# Patient Record
Sex: Male | Born: 1985 | Race: White | Hispanic: No | Marital: Married | Smoking: Never smoker
Health system: Southern US, Community
[De-identification: ages and names within clinical notes are randomized; demographics above are authoritative.]

## PROBLEM LIST (undated history)

## (undated) DIAGNOSIS — L309 Dermatitis, unspecified: Secondary | ICD-10-CM

## (undated) HISTORY — PX: TONSILLECTOMY: SUR1361

---

## 2010-05-12 ENCOUNTER — Other Ambulatory Visit: Payer: Self-pay | Admitting: Occupational Medicine

## 2010-05-12 ENCOUNTER — Ambulatory Visit
Admission: RE | Admit: 2010-05-12 | Discharge: 2010-05-12 | Disposition: A | Discharge status: Home / Self Care | Payer: PRIVATE HEALTH INSURANCE | Source: Ambulatory Visit | Attending: Occupational Medicine | Admitting: Occupational Medicine

## 2010-05-12 DIAGNOSIS — Z021 Encounter for pre-employment examination: Secondary | ICD-10-CM

## 2011-12-10 ENCOUNTER — Emergency Department (HOSPITAL_COMMUNITY)
Admission: EM | Admit: 2011-12-10 | Discharge: 2011-12-10 | Discharge status: Against Medical Advice | Payer: Worker's Compensation | Attending: Emergency Medicine | Admitting: Emergency Medicine

## 2011-12-10 ENCOUNTER — Encounter (HOSPITAL_COMMUNITY): Payer: Self-pay | Admitting: *Deleted

## 2011-12-10 DIAGNOSIS — Z049 Encounter for examination and observation for unspecified reason: Secondary | ICD-10-CM | POA: Insufficient documentation

## 2011-12-10 HISTORY — DX: Dermatitis, unspecified: L30.9

## 2011-12-10 NOTE — ED Notes (Signed)
Patient seen by [facility] coverage for PPE
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
## 2011-12-10 NOTE — ED Notes (Signed)
Pt eyes flushed bilaterally with 10cc's of saline.

## 2011-12-10 NOTE — ED Notes (Signed)
Pt requested to have eye flushed with saline.

## 2012-05-21 IMAGING — CR DG CHEST 1V
1 series · 1 of 1 positions shown · non-contrast
Comparison: None.

CLINICAL DATA: Pre employment physical

CHEST - 1 VIEW

[view not recorded]
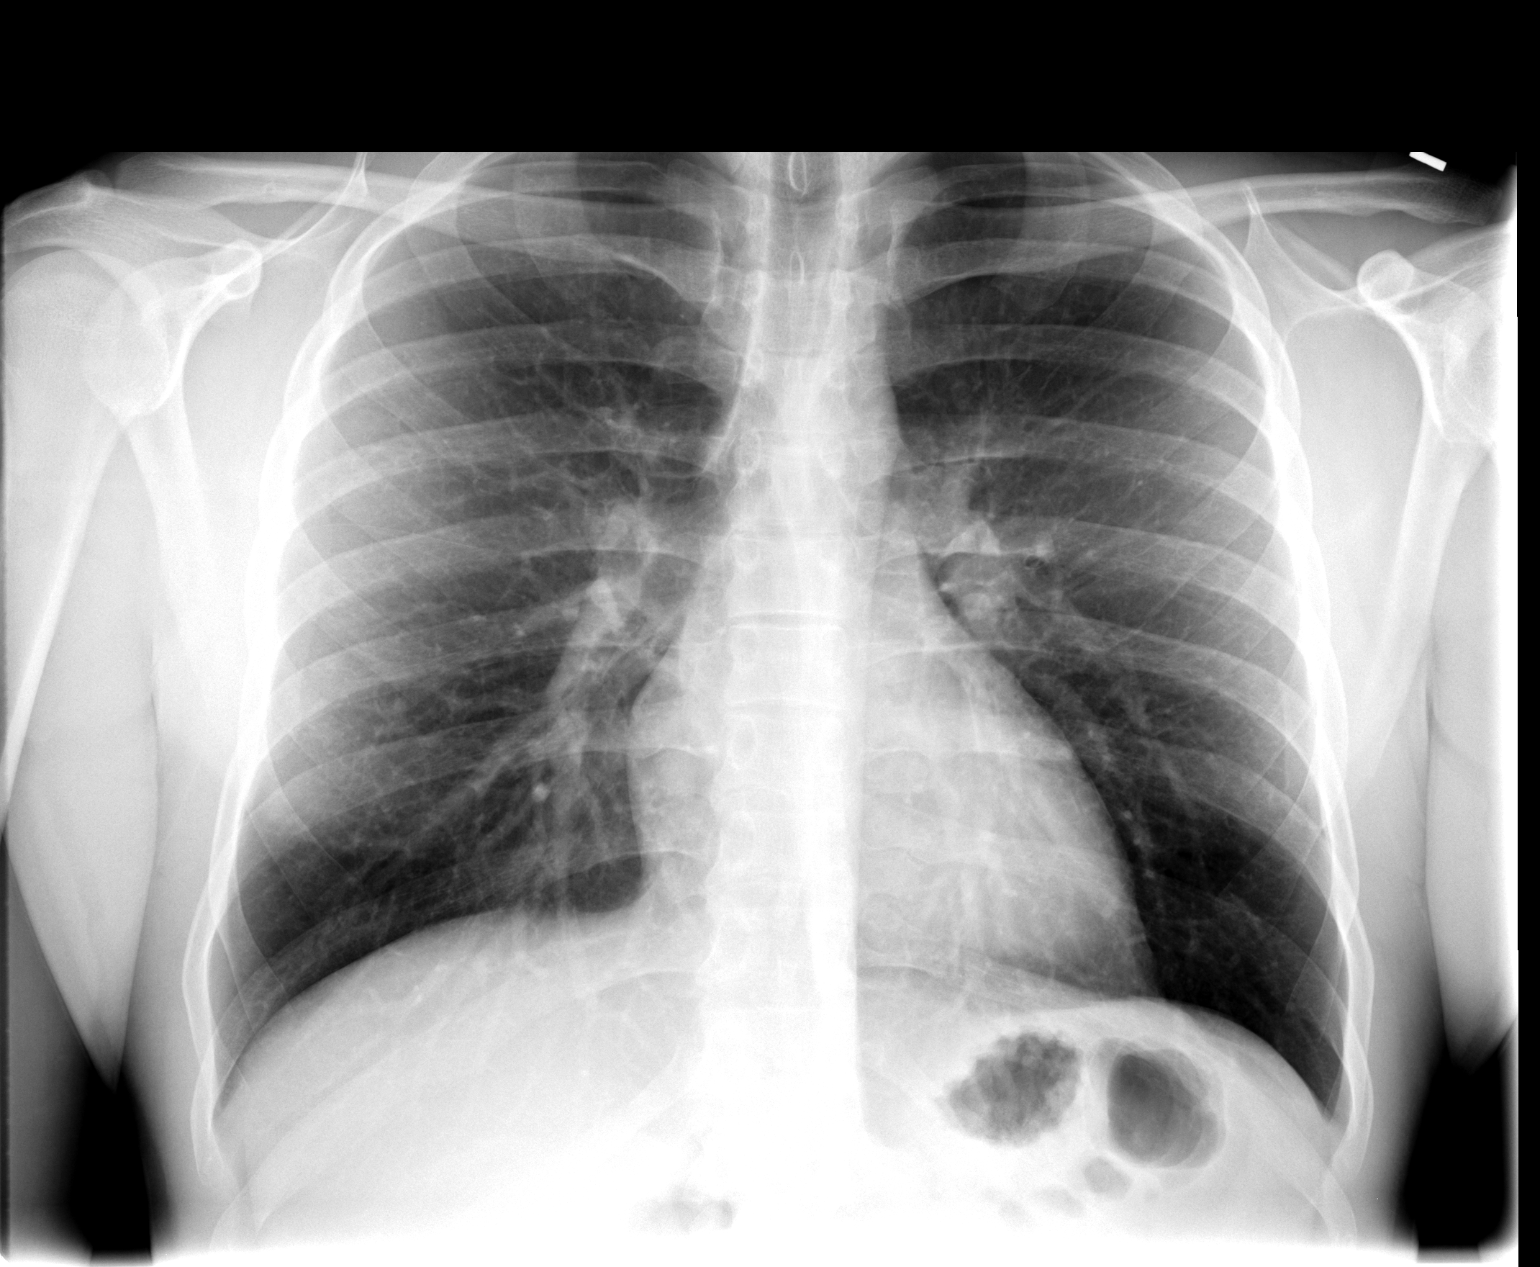

[1 of 1 positions shown; findings below may reference images not displayed]

FINDINGS: The cardiac silhouette, mediastinum, pulmonary
vasculature are within normal limits.  Both lungs are clear.
There is no acute bony abnormality.
IMPRESSION: There is no evidence of acute cardiac or pulmonary process.

## 2018-08-30 ENCOUNTER — Emergency Department (HOSPITAL_COMMUNITY)
Admission: EM | Admit: 2018-08-30 | Discharge: 2018-08-30 | Disposition: A | Discharge status: Home / Self Care | Payer: Worker's Compensation | Attending: Emergency Medicine | Admitting: Emergency Medicine

## 2018-08-30 ENCOUNTER — Encounter (HOSPITAL_COMMUNITY): Payer: Self-pay

## 2018-08-30 ENCOUNTER — Other Ambulatory Visit: Payer: Self-pay

## 2018-08-30 DIAGNOSIS — Z0000 Encounter for general adult medical examination without abnormal findings: Secondary | ICD-10-CM | POA: Diagnosis present

## 2018-08-30 DIAGNOSIS — Z5321 Procedure and treatment not carried out due to patient leaving prior to being seen by health care provider: Secondary | ICD-10-CM | POA: Insufficient documentation

## 2018-08-30 NOTE — ED Triage Notes (Signed)
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 Pt A&Ox4 and ambulatory

## 2020-07-13 ENCOUNTER — Ambulatory Visit (HOSPITAL_COMMUNITY)
Admission: EM | Admit: 2020-07-13 | Discharge: 2020-07-13 | Disposition: A | Discharge status: Home / Self Care | Payer: 59 | Attending: Student | Admitting: Student

## 2020-07-13 ENCOUNTER — Encounter (HOSPITAL_COMMUNITY): Payer: Self-pay | Admitting: Emergency Medicine

## 2020-07-13 ENCOUNTER — Other Ambulatory Visit: Payer: Self-pay

## 2020-07-13 DIAGNOSIS — J069 Acute upper respiratory infection, unspecified: Secondary | ICD-10-CM

## 2020-07-13 DIAGNOSIS — R590 Localized enlarged lymph nodes: Secondary | ICD-10-CM

## 2020-07-13 MED ORDER — PREDNISONE 20 MG PO TABS: 40.0000 mg | ORAL_TABLET | Freq: Every day | ORAL | 0 refills | Status: AC

## 2020-07-13 NOTE — ED Provider Notes (Signed)
[facility]    CSN: [id number] Arrival date & time: 07/13/20  1635      History   Chief Complaint Chief Complaint  Patient presents with   Sore Throat   Nasal Congestion    HPI Marc Marquez is a 35 y.o. male presenting with viral symptoms x4 days. Medical history tonsillectomy.  Endorses sore throat, nonproductive cough, congestion for 4 days.  Denies fever/chills, trouble swallowing.  Over-the-counter medications providing minimal relief. Denies fevers/chills, n/v/d, shortness of breath, chest pain, facial pain, teeth pain, headaches, loss of taste/smell, ear pain.   HPI  Past Medical History:  Diagnosis Date   Eczema     There are no problems to display for this patient.   Past Surgical History:  Procedure Laterality Date   TONSILLECTOMY         Home Medications    Prior to Admission medications   Medication Sig Start Date End Date Taking? Authorizing Provider  predniSONE (DELTASONE) 20 MG tablet Take 2 tablets (40 mg total) by mouth daily for 5 days. 07/13/20 07/18/20 Yes Rhys Martini, PA-C  metroNIDAZOLE (FLAGYL) 500 MG tablet Take 500 mg by mouth 2 (two) times daily.    [provider]    Family History History reviewed. No pertinent family history.  Social History Social History   Tobacco Use   Smoking status: Never   Smokeless tobacco: Never     Allergies   Patient has no known allergies.   Review of Systems Review of Systems  Constitutional:  Negative for appetite change, chills and fever.  HENT:  Positive for congestion and sore throat. Negative for ear pain, rhinorrhea, sinus pressure, sinus pain, trouble swallowing and voice change.   Eyes:  Negative for redness and visual disturbance.  Respiratory:  Positive for cough. Negative for chest tightness, shortness of breath and wheezing.   Cardiovascular:  Negative for chest pain and palpitations.  Gastrointestinal:  Negative for abdominal pain, constipation, diarrhea, nausea  and vomiting.  Genitourinary:  Negative for dysuria, frequency and urgency.  Musculoskeletal:  Negative for myalgias.  Neurological:  Negative for dizziness, weakness and headaches.  Psychiatric/Behavioral:  Negative for confusion.   All other systems reviewed and are negative.   Physical Exam Triage Vital Signs ED Triage Vitals  Enc Vitals Group     BP 07/13/20 1704 116/69     Pulse Rate 07/13/20 1704 (!) 110     Resp 07/13/20 1704 16     Temp 07/13/20 1704 98.9 F (37.2 C)     Temp Source 07/13/20 1704 Oral     SpO2 07/13/20 1704 97 %     Weight --      Height --      Head Circumference --      Peak Flow --      Pain Score 07/13/20 1701 3     Pain Loc --      Pain Edu? --      Excl. in GC? --    No data found.  Updated Vital Signs BP 116/69 (BP Location: Left Arm)   Pulse (!) 110   Temp 98.9 F (37.2 C) (Oral)   Resp 16   SpO2 97%   Visual Acuity Right Eye Distance:   Left Eye Distance:   Bilateral Distance:    Right Eye Near:   Left Eye Near:    Bilateral Near:     Physical Exam Vitals reviewed.  Constitutional:      General: He is not  in acute distress.    Appearance: Normal appearance. He is not ill-appearing.  HENT:     Head: Normocephalic and atraumatic.     Right Ear: Hearing, tympanic membrane, ear canal and external ear normal. No swelling or tenderness. There is no impacted cerumen. No mastoid tenderness. Tympanic membrane is not perforated, erythematous, retracted or bulging.     Left Ear: Hearing, tympanic membrane, ear canal and external ear normal. No swelling or tenderness. There is no impacted cerumen. No mastoid tenderness. Tympanic membrane is not perforated, erythematous, retracted or bulging.     Nose:     Right Sinus: No maxillary sinus tenderness or frontal sinus tenderness.     Left Sinus: No maxillary sinus tenderness or frontal sinus tenderness.     Mouth/Throat:     Mouth: Mucous membranes are moist.     Pharynx: Uvula midline.  Posterior oropharyngeal erythema present. No oropharyngeal exudate.     Tonsils: No tonsillar exudate.     Comments: Smooth erythema posterior pharynx. Tonsils not present. On exam, uvula is midline, he is tolerating secretions without difficulty, there is no trismus, no drooling, he has normal phonation  Cardiovascular:     Rate and Rhythm: Normal rate and regular rhythm.     Heart sounds: Normal heart sounds.  Pulmonary:     Breath sounds: Normal breath sounds and air entry. No wheezing, rhonchi or rales.  Chest:     Chest wall: No tenderness.  Abdominal:     General: Abdomen is flat. Bowel sounds are normal.     Tenderness: There is no abdominal tenderness. There is no guarding or rebound.  Lymphadenopathy:     Cervical: Cervical adenopathy present.     Right cervical: Superficial cervical adenopathy present.     Left cervical: Superficial cervical adenopathy present.  Neurological:     General: No focal deficit present.     Mental Status: He is alert and oriented to person, place, and time.  Psychiatric:        Attention and Perception: Attention and perception normal.        Mood and Affect: Mood and affect normal.        Behavior: Behavior normal. Behavior is cooperative.        Thought Content: Thought content normal.        Judgment: Judgment normal.     UC Treatments / Results  Labs (all labs ordered are listed, but only abnormal results are displayed) Labs Reviewed - No data to display  EKG   Radiology No results found.  Procedures Procedures (including critical care time)  Medications Ordered in UC Medications - No data to display  Initial Impression / Assessment and Plan / UC Course  I have reviewed the triage vital signs and the nursing notes.  Pertinent labs & imaging results that were available during my care of the patient were reviewed by me and considered in my medical decision making (see chart for details).     This patient is a very pleasant  35 y.o. year old male presenting with viral pharyngitis. Today this pt is afebrile but mildly tachy. Has not taken antipyretic.  History tonsillectomy and centor score 1, rapid strep deferred. Patient is in agreement.  Prednisone as below.  ED return precautions discussed. Patient verbalizes understanding and agreement. Work note provided.  Coding this visit a Level 4 for acute illness with systemic symptoms (tachycardia) and prescription drug management.   Final Clinical Impressions(s) / UC Diagnoses   Final  diagnoses:  Viral URI  Cervical lymphadenopathy     Discharge Instructions      -Prednisone, 2 pills taken at the same time for 5 days in a row.  Try taking this earlier in the day as it can give you energy. Avoid NSAIDs like ibuprofen and alleve while taking this medication as they can increase your risk of stomach upset and even GI bleeding when in combination with a steroid. You can continue tylenol (acetaminophen) up to 1000mg  3x daily. -Continue home remedies and over the counter medications for additional relief. -With a virus, you're typically contagious for 5-7 days, or as long as you're having fevers.       ED Prescriptions     Medication Sig Dispense Auth. Provider   predniSONE (DELTASONE) 20 MG tablet Take 2 tablets (40 mg total) by mouth daily for 5 days. 10 tablet , PA-C      PDMP not reviewed this encounter.   Rhys Martini, PA-C 07/13/20 1744

## 2020-07-13 NOTE — Discharge Instructions (Addendum)
-  Prednisone, 2 pills taken at the same time for 5 days in a row.  Try taking this earlier in the day as it can give you energy. Avoid NSAIDs like ibuprofen and alleve while taking this medication as they can increase your risk of stomach upset and even GI bleeding when in combination with a steroid. You can continue tylenol (acetaminophen) up to 1000mg  3x daily. -Continue home remedies and over the counter medications for additional relief. -With a virus, you're typically contagious for 5-7 days, or as long as you're having fevers.

## 2020-07-13 NOTE — ED Triage Notes (Signed)
Pt presents with sore throat, cough, and congestion Xs 4 days. States has taken benadryl, ibuprofen, and mucinex with some relief.

## 2023-02-02 ENCOUNTER — Ambulatory Visit
Admission: RE | Admit: 2023-02-02 | Discharge: 2023-02-02 | Disposition: A | Discharge status: Home / Self Care | Payer: 59 | Source: Ambulatory Visit | Attending: Internal Medicine | Admitting: Internal Medicine

## 2023-02-02 VITALS — BP 121/85 | HR 111 | Temp 99.8°F | Resp 16

## 2023-02-02 DIAGNOSIS — J101 Influenza due to other identified influenza virus with other respiratory manifestations: Secondary | ICD-10-CM

## 2023-02-02 LAB — POCT INFLUENZA A/B
Influenza A, POC: POSITIVE — AB
Influenza B, POC: NEGATIVE

## 2023-02-02 MED ORDER — OSELTAMIVIR PHOSPHATE 75 MG PO CAPS: 75.0000 mg | ORAL_CAPSULE | Freq: Two times a day (BID) | ORAL | 0 refills | Status: AC

## 2023-02-02 MED ORDER — PROMETHAZINE-DM 6.25-15 MG/5ML PO SYRP: 5.0000 mL | ORAL_SOLUTION | Freq: Every evening | ORAL | 0 refills | Status: AC | PRN

## 2023-02-02 MED ORDER — IBUPROFEN 800 MG PO TABS
800.0000 mg | ORAL_TABLET | Freq: Once | ORAL | Status: AC
  Administered 2023-02-02: 800 mg via ORAL

## 2023-02-02 NOTE — ED Triage Notes (Signed)
Pt states fever,cough,congestion and sore throat for the past 2 days.  States he has been  taking OTC cold medicine at home.

## 2023-02-02 NOTE — Discharge Instructions (Signed)
You have the flu. Take Tamiflu every 12 hours for the next 5 days to improve symptoms and stop the virus from replicating in your body. Ibuprofen/tylenol as needed for fevers and body aches. Mucinex as needed for nasal congestion. Promethazine DM at bedtime as needed for cough (This medication may make you drowsy so only take this at bedtime).

## 2023-02-02 NOTE — ED Provider Notes (Signed)
[name]    CSN: [id number] Arrival date & time: 02/02/23  1125      History   Chief Complaint Chief Complaint  Patient presents with   Fever    Cough, headache, congestion, body ache - Entered by patient    HPI Marc Marquez is a 38 y.o. male.   Marc Marquez is a 38 y.o. male presenting for chief complaint of fever, chills, cough, nasal congestion, body aches, and headache that started 2 days ago.  Reports recent contact with his cousin who is sick with similar symptoms.  Cough is mostly dry nonproductive.  Max temp at home 100.0.  Denies N/V/D, abdominal pain, rash, dizziness, chest pain, heart palpitations, and shortness of breath.  Never smoker, denies drug use.  Denies history of chronic respiratory problems.  Taking Tylenol cold and flu medicine without much relief.   Fever   Past Medical History:  Diagnosis Date   Eczema     There are no active problems to display for this patient.   Past Surgical History:  Procedure Laterality Date   TONSILLECTOMY         Home Medications    Prior to Admission medications   Medication Sig Start Date End Date Taking? Authorizing Provider  oseltamivir (TAMIFLU) 75 MG capsule Take 1 capsule (75 mg total) by mouth every 12 (twelve) hours. 02/02/23  Yes Carlisle Beers, FNP  promethazine-dextromethorphan (PROMETHAZINE-DM) 6.25-15 MG/5ML syrup Take 5 mLs by mouth at bedtime as needed for cough. 02/02/23  Yes Carlisle Beers, FNP  metroNIDAZOLE (FLAGYL) 500 MG tablet Take 500 mg by mouth 2 (two) times daily.    [provider]    Family History History reviewed. No pertinent family history.  Social History Social History   Tobacco Use   Smoking status: Never   Smokeless tobacco: Never     Allergies   Patient has no known allergies.   Review of Systems Review of Systems  Constitutional:  Positive for fever.  Per HPI   Physical Exam Triage Vital Signs ED Triage Vitals  Encounter  Vitals Group     BP 02/02/23 1135 121/85     Systolic BP Percentile --      Diastolic BP Percentile --      Pulse Rate 02/02/23 1135 (!) 111     Resp 02/02/23 1135 16     Temp 02/02/23 1135 98.8 F (37.1 C)     Temp Source 02/02/23 1135 Oral     SpO2 02/02/23 1135 96 %     Weight --      Height --      Head Circumference --      Peak Flow --      Pain Score 02/02/23 1138 0     Pain Loc --      Pain Education --      Exclude from Growth Chart --    No data found.  Updated Vital Signs BP 121/85 (BP Location: Right Arm)   Pulse (!) 111   Temp 99.8 F (37.7 C)   Resp 16   SpO2 96%   Visual Acuity Right Eye Distance:   Left Eye Distance:   Bilateral Distance:    Right Eye Near:   Left Eye Near:    Bilateral Near:     Physical Exam Vitals and nursing note reviewed.  Constitutional:      Appearance: He is not ill-appearing or toxic-appearing.  HENT:     Head: Normocephalic and atraumatic.  Right Ear: Hearing, tympanic membrane, ear canal and external ear normal.     Left Ear: Hearing, tympanic membrane, ear canal and external ear normal.     Nose: Congestion present.     Mouth/Throat:     Lips: Pink.     Mouth: Mucous membranes are moist. No injury or oral lesions.     Dentition: Normal dentition.     Tongue: No lesions.     Pharynx: Oropharynx is clear. Uvula midline. No pharyngeal swelling, oropharyngeal exudate, posterior oropharyngeal erythema, uvula swelling or postnasal drip.     Tonsils: No tonsillar exudate.  Eyes:     General: Lids are normal. Vision grossly intact. Gaze aligned appropriately.     Extraocular Movements: Extraocular movements intact.     Conjunctiva/sclera: Conjunctivae normal.  Neck:     Trachea: Trachea and phonation normal.  Cardiovascular:     Rate and Rhythm: Normal rate and regular rhythm.     Heart sounds: Normal heart sounds, S1 normal and S2 normal.  Pulmonary:     Effort: Pulmonary effort is normal. No respiratory  distress.     Breath sounds: Normal breath sounds and air entry.  Musculoskeletal:     Cervical back: Neck supple.  Lymphadenopathy:     Cervical: No cervical adenopathy.  Skin:    General: Skin is warm and dry.     Capillary Refill: Capillary refill takes less than 2 seconds.     Findings: No rash.  Neurological:     General: No focal deficit present.     Mental Status: He is alert and oriented to person, place, and time. Mental status is at baseline.     Cranial Nerves: No dysarthria or facial asymmetry.  Psychiatric:        Mood and Affect: Mood normal.        Speech: Speech normal.        Behavior: Behavior normal.        Thought Content: Thought content normal.        Judgment: Judgment normal.      UC Treatments / Results  Labs (all labs ordered are listed, but only abnormal results are displayed) Labs Reviewed  POCT INFLUENZA A/B - Abnormal; Notable for the following components:      Result Value   Influenza A, POC Positive (*)    All other components within normal limits    EKG   Radiology No results found.  Procedures Procedures (including critical care time)  Medications Ordered in UC Medications  ibuprofen (ADVIL) tablet 800 mg (800 mg Oral Given 02/02/23 1153)    Initial Impression / Assessment and Plan / UC Course  I have reviewed the triage vital signs and the nursing notes.  Pertinent labs & imaging results that were available during my care of the patient were reviewed by me and considered in my medical decision making (see chart for details).   1.  Influenza A Flu A point of care testing positive. Lungs clear, vitals hemodynamically stable, therefore deferred imaging. Interventions in clinic: ibuprofen 800mg  for low grade fever and headache. Offered antiviral given timing of illness, Tamiflu sent to pharmacy.  Recommend supportive care for further symptomatic relief as outlined in AVS.  Modes of transmission, quarantine recommendations, and  hand hygiene discussed.  Counseled patient on potential for adverse effects with medications prescribed/recommended today, strict ER and return-to-clinic precautions discussed, patient verbalized understanding.    Final Clinical Impressions(s) / UC Diagnoses   Final diagnoses:  Influenza A  Discharge Instructions      You have the flu. Take Tamiflu every 12 hours for the next 5 days to improve symptoms and stop the virus from replicating in your body. Ibuprofen/tylenol as needed for fevers and body aches. Mucinex as needed for nasal congestion. Promethazine DM at bedtime as needed for cough (This medication may make you drowsy so only take this at bedtime).     ED Prescriptions     Medication Sig Dispense Auth. Provider   oseltamivir (TAMIFLU) 75 MG capsule Take 1 capsule (75 mg total) by mouth every 12 (twelve) hours. 10 capsule Carlisle Beers, FNP   promethazine-dextromethorphan (PROMETHAZINE-DM) 6.25-15 MG/5ML syrup Take 5 mLs by mouth at bedtime as needed for cough. 118 mL Carlisle Beers, FNP      PDMP not reviewed this encounter.   Carlisle Beers, [place] 02/02/23 1210
